# Patient Record
Sex: Male | Born: 1945 | Race: White | Hispanic: No | Marital: Married
Health system: Southern US, Community
[De-identification: ages and names within clinical notes are randomized; demographics above are authoritative.]

---

## 2004-08-25 ENCOUNTER — Emergency Department: Payer: Self-pay | Admitting: Emergency Medicine

## 2007-10-29 ENCOUNTER — Encounter: Payer: Self-pay | Admitting: Internal Medicine

## 2007-10-29 ENCOUNTER — Ambulatory Visit: Payer: Self-pay | Admitting: Internal Medicine

## 2007-11-04 ENCOUNTER — Encounter: Payer: Self-pay | Admitting: Internal Medicine

## 2008-11-02 ENCOUNTER — Emergency Department: Payer: Self-pay | Admitting: Emergency Medicine

## 2008-11-29 ENCOUNTER — Emergency Department: Payer: Self-pay | Admitting: Emergency Medicine

## 2010-07-13 IMAGING — CT CT ABD-PELV W/ CM
1 of 3 series · 13 of 32 positions shown, 19 images · non-contrast
Comparison: None

REASON FOR EXAM: RLQ pain with nausea that began today
COMMENTS:

PROCEDURE:     CT  - CT ABDOMEN / PELVIS  W  - November 30, 2008 [DATE]
RESULT:     History: Right lower quadrant pain
TECHNIQUE: Multiple axial images of the abdomen and pelvis were performed
from the lung bases to the pubic symphysis, with p.o. contrast and with 100
mL of Asovue-K0M intravenous contrast.

[Series 2: appendicitis · axial · 0.77mm/px · z∈[+1266,+1674]mm · 13 of 160 slices shown, 19 images]
[im 12/160  soft-tissue]
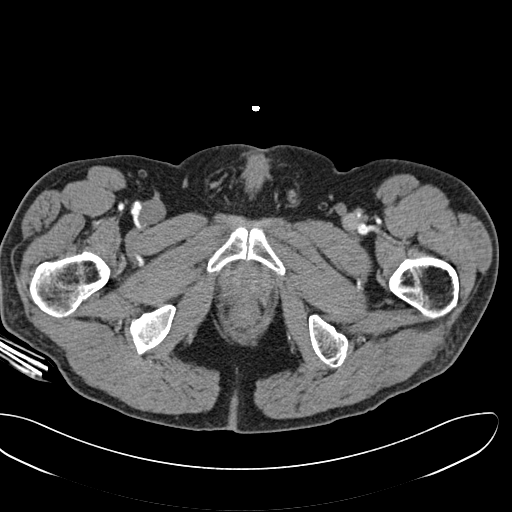
[im 12/160  bone]
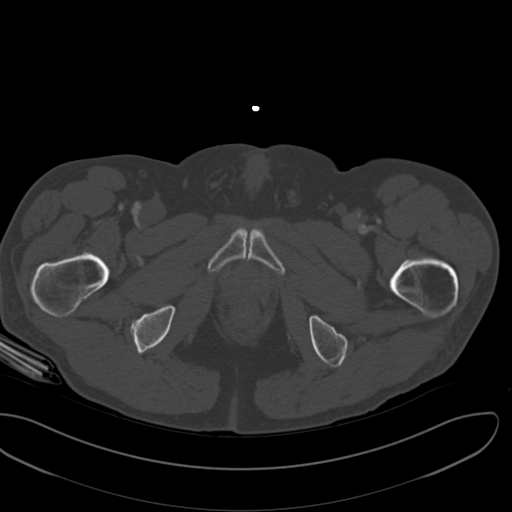
[im 23/160  soft-tissue]
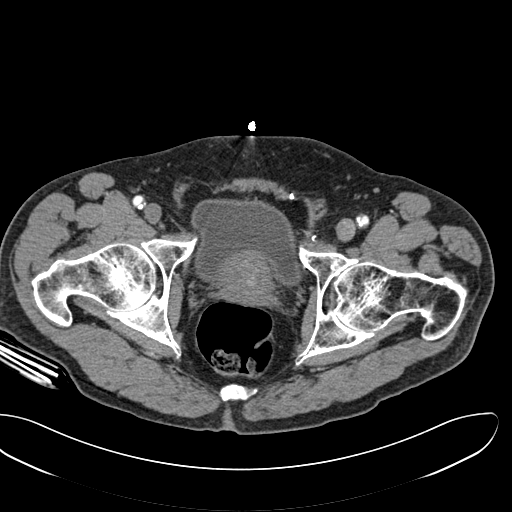
[im 35/160  soft-tissue]
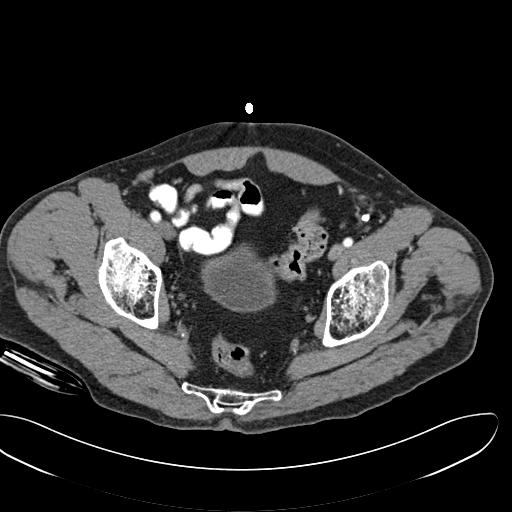
[im 46/160  soft-tissue]
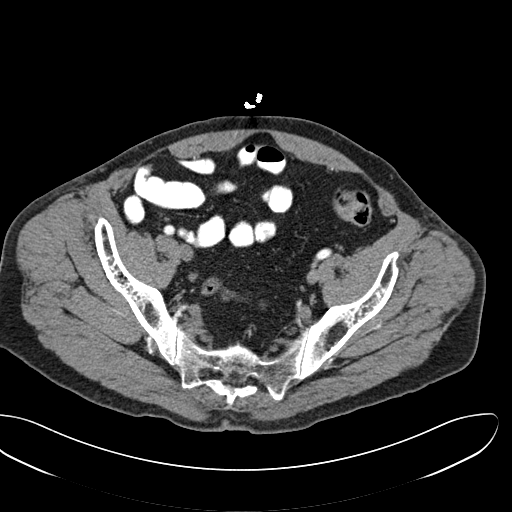
[im 57/160  soft-tissue]
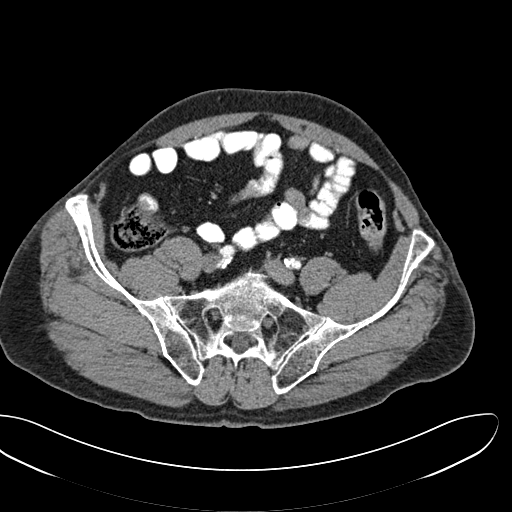
[im 69/160  soft-tissue]
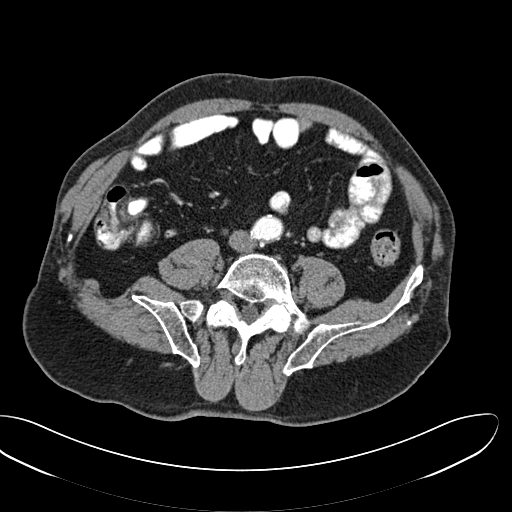
[im 80/160  soft-tissue]
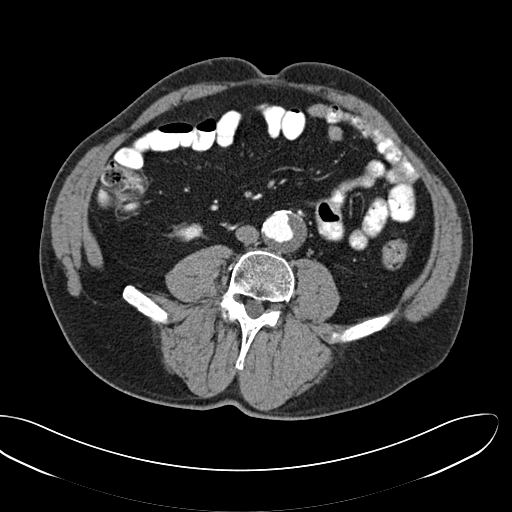
[im 91/160  soft-tissue]
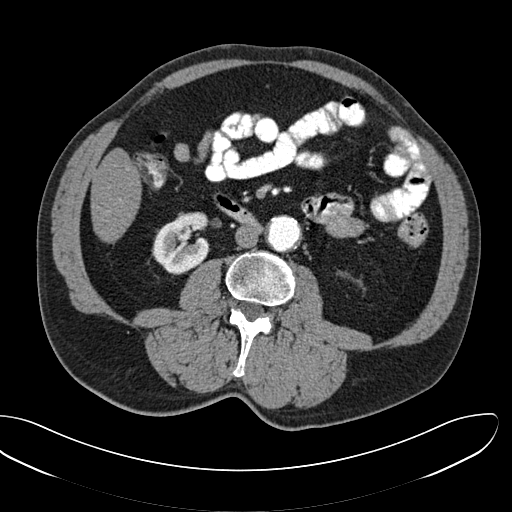
[im 103/160  soft-tissue]
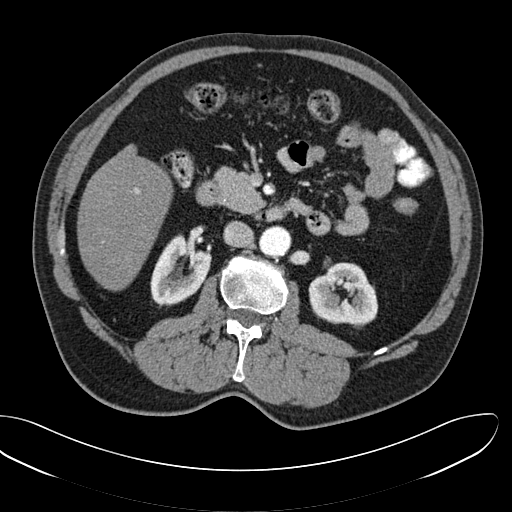
[im 103/160  bone]
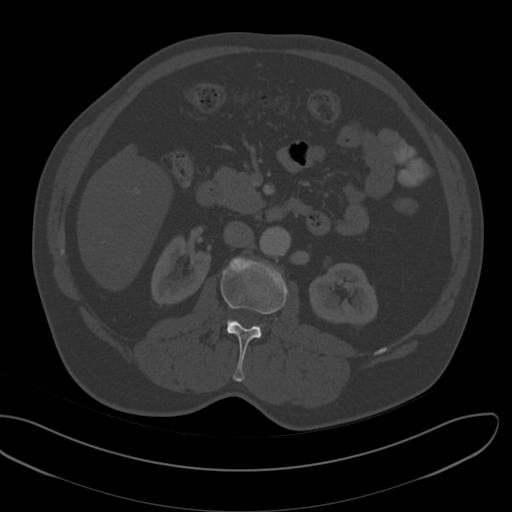
[im 114/160  soft-tissue]
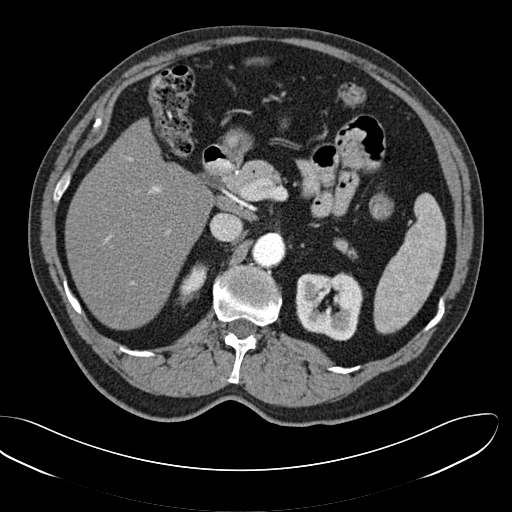
[im 114/160  lung]
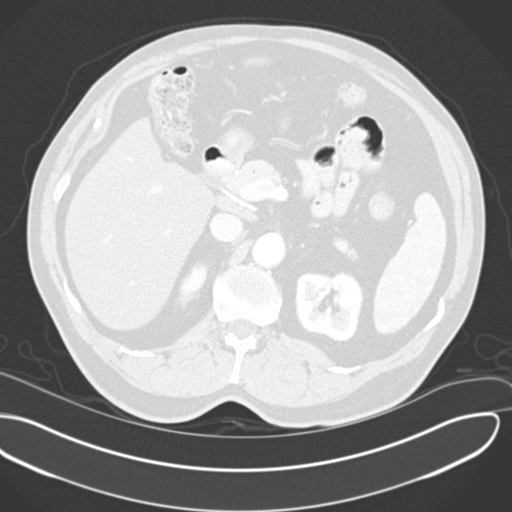
[im 125/160  soft-tissue]
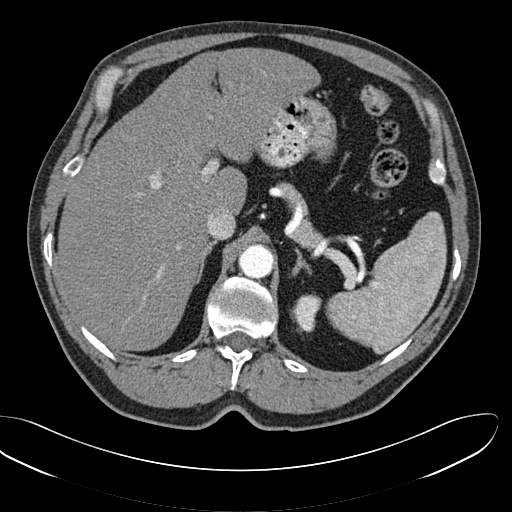
[im 125/160  lung]
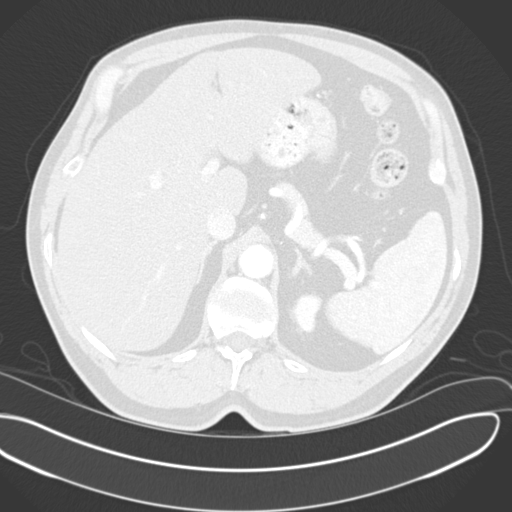
[im 137/160  soft-tissue]
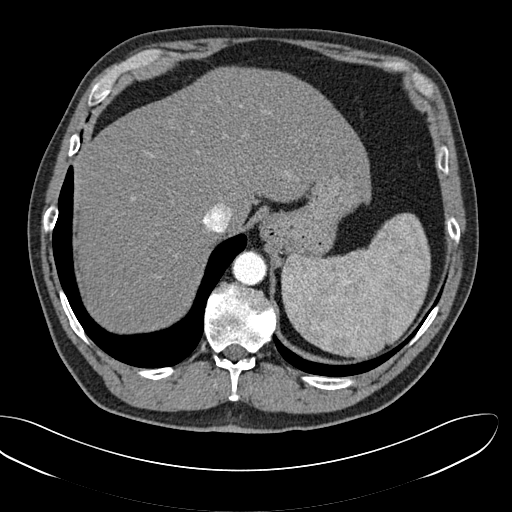
[im 137/160  lung]
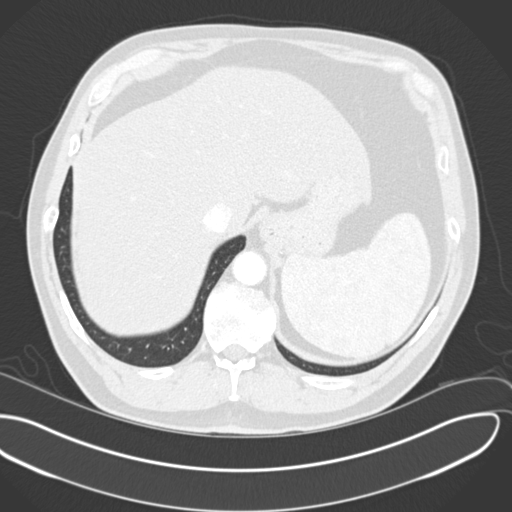
[im 148/160  soft-tissue]
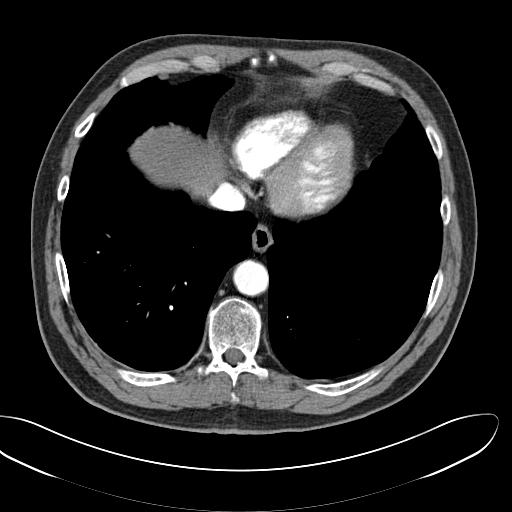
[im 148/160  lung]
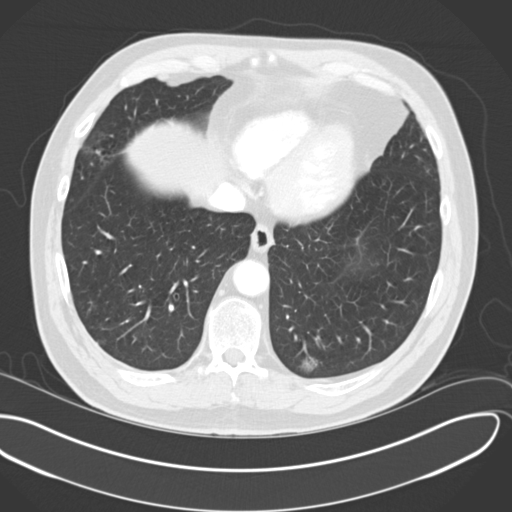

[13 of 32 positions shown; findings below may reference images not displayed]

FINDINGS: The lung bases are clear. There is no pneumothorax. The heart size is
normal.

The liver demonstrates no focal abnormality. There is no intrahepatic or
extrahepatic biliary ductal dilatation. The gallbladder is surgically
absent. The spleen demonstrates no focal abnormality. The kidneys, adrenal
glands, and pancreas are normal. The bladder is unremarkable. There is a
prominent sized prostate gland.

There is a small hiatal hernia. The duodenum, small intestine, and large
intestine demonstrate no contrast extravasation or dilatation. A normal
caliber appendix is visualized in the right lower quadrant without
periappendiceal inflammatory changes. There is no pneumoperitoneum,
pneumatosis, or portal venous gas. There is no abdominal or pelvic free
fluid. There is no lymphadenopathy.

There is a small infrarenal abdominal aortic aneurysm measuring 3.5 cm in
greatest AP diameter and 3.6 cm in greatest transverse diameter. There is a
significant amount of mural thrombus present.

The osseous structures are unremarkable.
IMPRESSION: Normal appendix.

Infrarenal abdominal aortic aneurysm.

## 2011-04-27 ENCOUNTER — Emergency Department: Payer: Self-pay | Admitting: Internal Medicine

## 2013-05-27 ENCOUNTER — Emergency Department: Payer: Self-pay | Admitting: Emergency Medicine

## 2013-05-29 ENCOUNTER — Inpatient Hospital Stay: Payer: Self-pay | Admitting: Internal Medicine

## 2013-05-29 LAB — COMPREHENSIVE METABOLIC PANEL
Albumin: 3.9 g/dL (ref 3.4–5.0)
Alkaline Phosphatase: 112 U/L (ref 50–136)
BUN: 17 mg/dL (ref 7–18)
Calcium, Total: 8.8 mg/dL (ref 8.5–10.1)
EGFR (Non-African Amer.): 35 — ABNORMAL LOW
Glucose: 105 mg/dL — ABNORMAL HIGH (ref 65–99)
Potassium: 4 mmol/L (ref 3.5–5.1)
Total Protein: 7.5 g/dL (ref 6.4–8.2)

## 2013-05-29 LAB — CBC
HGB: 12.7 g/dL — ABNORMAL LOW (ref 13.0–18.0)
MCH: 27.6 pg (ref 26.0–34.0)
MCV: 81 fL (ref 80–100)
Platelet: 118 10*3/uL — ABNORMAL LOW (ref 150–440)
RBC: 4.62 10*6/uL (ref 4.40–5.90)
RDW: 13.3 % (ref 11.5–14.5)
WBC: 9 10*3/uL (ref 3.8–10.6)

## 2013-05-30 LAB — CBC WITH DIFFERENTIAL/PLATELET
Eosinophil #: 0.2 10*3/uL (ref 0.0–0.7)
HGB: 11.6 g/dL — ABNORMAL LOW (ref 13.0–18.0)
Lymphocyte %: 12.7 %
MCHC: 34.7 g/dL (ref 32.0–36.0)
MCV: 81 fL (ref 80–100)
Monocyte #: 0.6 x10 3/mm (ref 0.2–1.0)
Monocyte %: 9.1 %
Neutrophil #: 5.2 10*3/uL (ref 1.4–6.5)
Neutrophil %: 75 %
RBC: 4.14 10*6/uL — ABNORMAL LOW (ref 4.40–5.90)
RDW: 13.4 % (ref 11.5–14.5)

## 2013-05-30 LAB — BASIC METABOLIC PANEL
BUN: 14 mg/dL (ref 7–18)
Calcium, Total: 8.4 mg/dL — ABNORMAL LOW (ref 8.5–10.1)
Co2: 21 mmol/L (ref 21–32)
EGFR (African American): 41 — ABNORMAL LOW
EGFR (Non-African Amer.): 35 — ABNORMAL LOW
Glucose: 109 mg/dL — ABNORMAL HIGH (ref 65–99)
Potassium: 4.1 mmol/L (ref 3.5–5.1)

## 2013-06-01 LAB — BASIC METABOLIC PANEL
BUN: 17 mg/dL (ref 7–18)
Calcium, Total: 8.7 mg/dL (ref 8.5–10.1)
Chloride: 107 mmol/L (ref 98–107)
Co2: 24 mmol/L (ref 21–32)
Creatinine: 1.75 mg/dL — ABNORMAL HIGH (ref 0.60–1.30)
EGFR (African American): 45 — ABNORMAL LOW
EGFR (Non-African Amer.): 39 — ABNORMAL LOW
Glucose: 100 mg/dL — ABNORMAL HIGH (ref 65–99)
Potassium: 4.2 mmol/L (ref 3.5–5.1)
Sodium: 137 mmol/L (ref 136–145)

## 2013-06-01 LAB — CBC WITH DIFFERENTIAL/PLATELET
Basophil #: 0 10*3/uL (ref 0.0–0.1)
Eosinophil #: 0.2 10*3/uL (ref 0.0–0.7)
Eosinophil %: 3.6 %
HCT: 33.4 % — ABNORMAL LOW (ref 40.0–52.0)
Lymphocyte #: 1.2 10*3/uL (ref 1.0–3.6)
MCHC: 34.5 g/dL (ref 32.0–36.0)
Monocyte #: 0.6 x10 3/mm (ref 0.2–1.0)
Neutrophil #: 3.9 10*3/uL (ref 1.4–6.5)
Neutrophil %: 65.4 %
Platelet: 141 10*3/uL — ABNORMAL LOW (ref 150–440)
RBC: 4.15 10*6/uL — ABNORMAL LOW (ref 4.40–5.90)
RDW: 13 % (ref 11.5–14.5)
WBC: 5.9 10*3/uL (ref 3.8–10.6)

## 2013-06-03 LAB — WOUND CULTURE

## 2013-11-18 ENCOUNTER — Ambulatory Visit: Payer: Self-pay | Admitting: Family Medicine

## 2014-11-25 NOTE — H&P (Signed)
PATIENT NAME:  Andre Lowe, Andre Lowe MR#:  [id number] DATE OF BIRTH:  05-Nov-1945  DATE OF ADMISSION:  05/29/2013  [facility] CARE PROVID[facility]  [facility].  EMERGENCY DEPARTMENT REFERRING PHYSICIAN:  Dr. Mindi Junker.   CHIEF COMPLAINT:  Left forearm swelling and erythema.   HISTORY OF PRESENT ILLNESS:  The patient is a 69 year old white male with a history of depression, hyperlipidemia, GERD, hiatal hernia, who reports that a few days ago, he developed a small lesion on his forearm associated with a possible ingrown hair. Subsequently that became like a boil and then started draining. He was seen in the ED on the 23rd where he was started on antibiotics with Bactrim. The patient reports he has been taking Bactrim and then subsequently he also used hydrogen peroxide on his arm, and now he has a large area of skin that has came off near that lesion as well as progressive erythema and warmth on his forearm extending to his elbow. The patient also reports a small lesions on his neck as well as a small boil in his abdomen. He has not had any fevers, chills. The patient denies any previous history of recurrent cellulitis or staph infection in the past. Otherwise, he is denying any abdominal pain, nausea, vomiting or diarrhea.   PAST MEDICAL HISTORY: Significant for:  1.  Depression.  2.  Hyperlipidemia.  3.  GERD with a history of hiatal hernia.  4.  The patient has chronic renal failure based on his creatinine reviewed from 04/27/2011 and then his creatinine is elevated today.   PAST SURGICAL HISTORY:  A history of left leg surgery after a trauma.   ALLERGIES:  None.   MEDICATIONS:  He is on:  1.  Sertraline 100 daily. 2.  Gemfibrozil 600, 1 tab p.o. b.i.d. 3.  Meclizine 25 mg 1 tab p.o. t.i.d. p.r.n. 4.   Omeprazole 20, 1 tab p.o. daily.  5.  Trazodone p.o. at bedtime.  6.  Hydrocodone 10/325 mg 1 tab p.o. q.6 p.r.n.   ALLERGIES:  None.   SOCIAL HISTORY:  Does not smoke, quit 20 years ago. No alcohol or  drug use. Lives with his wife.   FAMILY HISTORY:  Positive for hypertension.   REVIEW OF SYSTEMS:  CONSTITUTIONAL:  Denies any fevers. Denies any fatigue, weakness. Has chronic pain related to his leg injury. No weight loss, no weight gain.  EYES:  No blurred or double vision. No pain. No redness. No inflammation. No glaucoma.  ENT:  No tinnitus. No ear pain. No hearing loss. No seasonal or year-round allergies. No epistaxis. No nasal discharge. No snoring. No postnasal drip. No difficulty swallowing.  RESPIRATORY:  Denies any cough, wheezing, hemoptysis. No COPD, no TB.  CARDIOVASCULAR:  Denies any orthopnea, edema or arrhythmia.  GASTROINTESTINAL:  No nausea, vomiting, diarrhea. No abdominal pain. No hematemesis. No melena. No ulcer. Does have GERD. No IBS. No jaundice. No rectal bleeding.  GENITOURINARY: Denies any dysuria, hematuria, renal calculus or frequency.  ENDOCRINE:  Denies any polyuria or nocturia, or thyroid problems.  HEMATOLOGIC AND LYMPHATIC: Denies any easy bruisability or bleeding.  SKIN:  He complains of erythema involving the leg. He is complaining of small papular-like lesions on his abdomen as well as back, as well as erythema involving his right forearm.  NEUROLOGIC:  No numbness. No CVA. No TIA.  PSYCHIATRIC:  Does have a history of anxiety and depression. No bipolar, no schizophrenia.   PHYSICAL EXAMINATION:  VITAL SIGNS:  Temperature 97.8, pulse 70, respirations 18,  blood pressure 151/69, O2 95%.  GENERAL:  The patient is a well-developed, well-nourished male in no acute distress.  HEENT:  Head atraumatic, normocephalic. Pupils equally round, reactive to light and accommodation. There is no conjunctival pallor. No scleral icterus. Nasal exam shows no drainage or ulceration.  OROPHARYNX:  Clear without any exudate.  NECK:  Supple without any JVD.  CARDIOVASCULAR:  Regular rate and rhythm. No murmurs, rubs, clicks, or gallops. PMI is not displaced.  RESPIRATORY:  No  accessory muscle usage, clear to auscultation bilaterally without any rales, rhonchi, wheezing.  ABDOMEN:  Soft.  EXTREMITIES:  No clubbing, cyanosis, or edema. He has a support boot on on the left lower extremity.  LYMPHATICS:  No lymph nodes palpable.  VASCULAR:  Good DP and PT pulses.  PSYCHIATRIC:  Not anxious or depressed.  NEUROLOGIC:  Awake, alert, oriented x 3. No focal deficits.  SKIN:  The patient had small lesions, some excoriated on his neck and one lesion on his abdomen as well as his left forearm as well. The right forearm has got a large, approximately 2.5 x 2.5 cm lesion, circula-0 chromic with denuded skin with flesh-appearing with surrounding erythema extending to his elbow and his hands with warmth noted.   EVALUATIONS:  Glucose 105, BUN 17, creatinine 1.91, sodium 135, potassium 4.0, chloride 106, CO2 is 22. His GFR is 41. LFTs are normal.   ASSESSMENT AND PLAN:  The patient is a 69 year old white male with history of GERD, hyperlipidemia, and depression, who developed a small lesion on his right forearm and now has erythema and swelling.  1.  Right forearm cellulitis. Based on the appearance, this is likely methicillin-resistant Staphylococcus aureus. At this time, we will treat him with IV vancomycin and Unasyn. Unasyn should cover other organisms including gram-negatives and anaerobic coverage. The patient also has multiple other lesions on his body. At discharge would recommend Hibiclens washes.  2.  Gastroesophageal reflux disease. We will continue omeprazole.  3.  Likely chronic renal failure, stage III, based on his creatinine reviewed from 2012. This is chronic in nature. [facility]. The patient [facility] be [facility] or locally at discharge.  4.  Depression, anxiety. Continue trazodone and sertraline as taking at home.  5.  Hyperlipidemia. Continue gemfibrozil.  6.  Miscellaneous. We will place him on heparin for DVT  prophylaxis.   TIME SPENT:  Note 45 minutes spent on this H and P.   ____________________________ Lacie ScottsShreyang H. Allena KatzPatel, MD shp:jm D: 05/29/2013 11:43:16 ET T: 05/29/2013 12:22:33 ET JOB#: [id number]  cc: Malissia Rabbani H. Allena KatzPatel, MD, <Dictator> Charise CarwinSHREYANG H Senna Lape MD ELECTRONICALLY SIGNED 06/01/2013 12:14

## 2014-11-25 NOTE — Discharge Summary (Signed)
PATIENT NAME:  Andre ForehandJOHNSON, Andre W MR#:  [id number] DATE OF BIRTH:  Oct 16, 1945  DATE OF ADMISSION:  05/29/2013 DATE OF DISCHARGE:  06/01/2013  ADMITTING PHYSICIAN: Shreyang H. Allena KatzPatel, MD  DISCHARGING PHYSICIAN: Enid Baasadhika Kaiser Belluomini, MD   [facility] [place]   CONSULTATIONS IN THE HOSPITAL: None.   DISCHARGE DIAGNOSES: 1.  Right forearm cellulitis with methicillin-resistant Staphylococcus aureus wound infection.  2.  Hyperlipidemia.  3.  Chronic kidney disease with baseline creatinine around 1.9.  4.  Status post left leg crush injury, using prosthetic device.  5.  Multiple boils on the body.   DISCHARGE MEDICATIONS:   1.  Omeprazole 20 mg p.o. daily.  2.  Sertraline 100 mg p.o. daily.  3.  Lortab 5/500 mg, 1 tablet q.6 hours p.r.n. for pain.  4.  Tramadol 50 mg p.o. q.6 hours p.r.n. for pain.  5.  Trazodone 100 mg p.o. at bedtime.  6.  Bactrim double-strength 1 tablet p.o. b.i.d. for 9 days.  7.  Hibiclens 4% topical soap applied to affected area once a day.   DISCHARGE DIET: Low-fat, low-cholesterol diet.   DISCHARGE ACTIVITY: As tolerated.    FOLLOWUP INSTRUCTIONS: 1.  PCP followup in 1 to 2 weeks.  2.  Keep right arm elevated until swelling has improved. If no improvement within a week, call MD or if symptoms worsen call MD or come to the ER.   LABS AND IMAGING STUDIES:  1.  WBC 5.9, hemoglobin 11.5, hematocrit 33.4, platelet count 141.  2.  Sodium 137, potassium 4.2, chloride 107, bicarb 24, BUN 17, creatinine 1.73, glucose 100 and calcium of 8.7.  3.  Wound cultures growing MRSA.   4.  Blood cultures were negative.   BRIEF HOSPITAL COURSE: Mr. Andre Lowe is a 69 year old elderly male with past medical history significant for chronic kidney disease, depression and gastroesophageal reflux disease. He presented to the hospital secondary to worsening right forearm swelling.  1.  Right forearm cellulitis with an open wound. Wound cultures growing MRSA.  He was  initially placed on vancomycin and Unasyn. Once the culture results were back, the patient was continued on vancomycin and Unasyn was discontinued. He was already on contact isolation. He also has multiple boils on his body so chlorhexidine soap is being prescribed at the time of discharge. He is being discharged on Bactrim and his swelling has come down immensely. He was also advised to keep his arm up even after discharge. All his other medications were continued without any changes and he was advised to come back to the ER or call MD if his symptoms were to worsen. His course has been otherwise uneventful in the hospital.   DISCHARGE CONDITION: Stable.   DISCHARGE DISPOSITION: Home.   TIME SPENT ON DISCHARGE: 40 minutes.   ____________________________ Enid Baasadhika Shaquanda Graves, MD rk:cs D: 06/01/2013 15:27:09 ET T: 06/01/2013 18:30:58 ET JOB#: [id number]  cc: Enid Baasadhika Chamar Broughton, MD, <Dictator> [facility] Enid BaasADHIKA Faige Seely MD ELECTRONICALLY SIGNED 06/15/2013 13:55

## 2014-12-04 DEATH — deceased

## 2015-07-01 IMAGING — CR DG CHEST 2V
1 series · 2 of 2 positions shown · non-contrast
Comparison: None.

CLINICAL DATA: Weight loss, bradycardia

EXAM:
CHEST  2 VIEW

[Series 1: pa · 0.17mm/px · 2 of 2 slices shown]
[im 1/2]
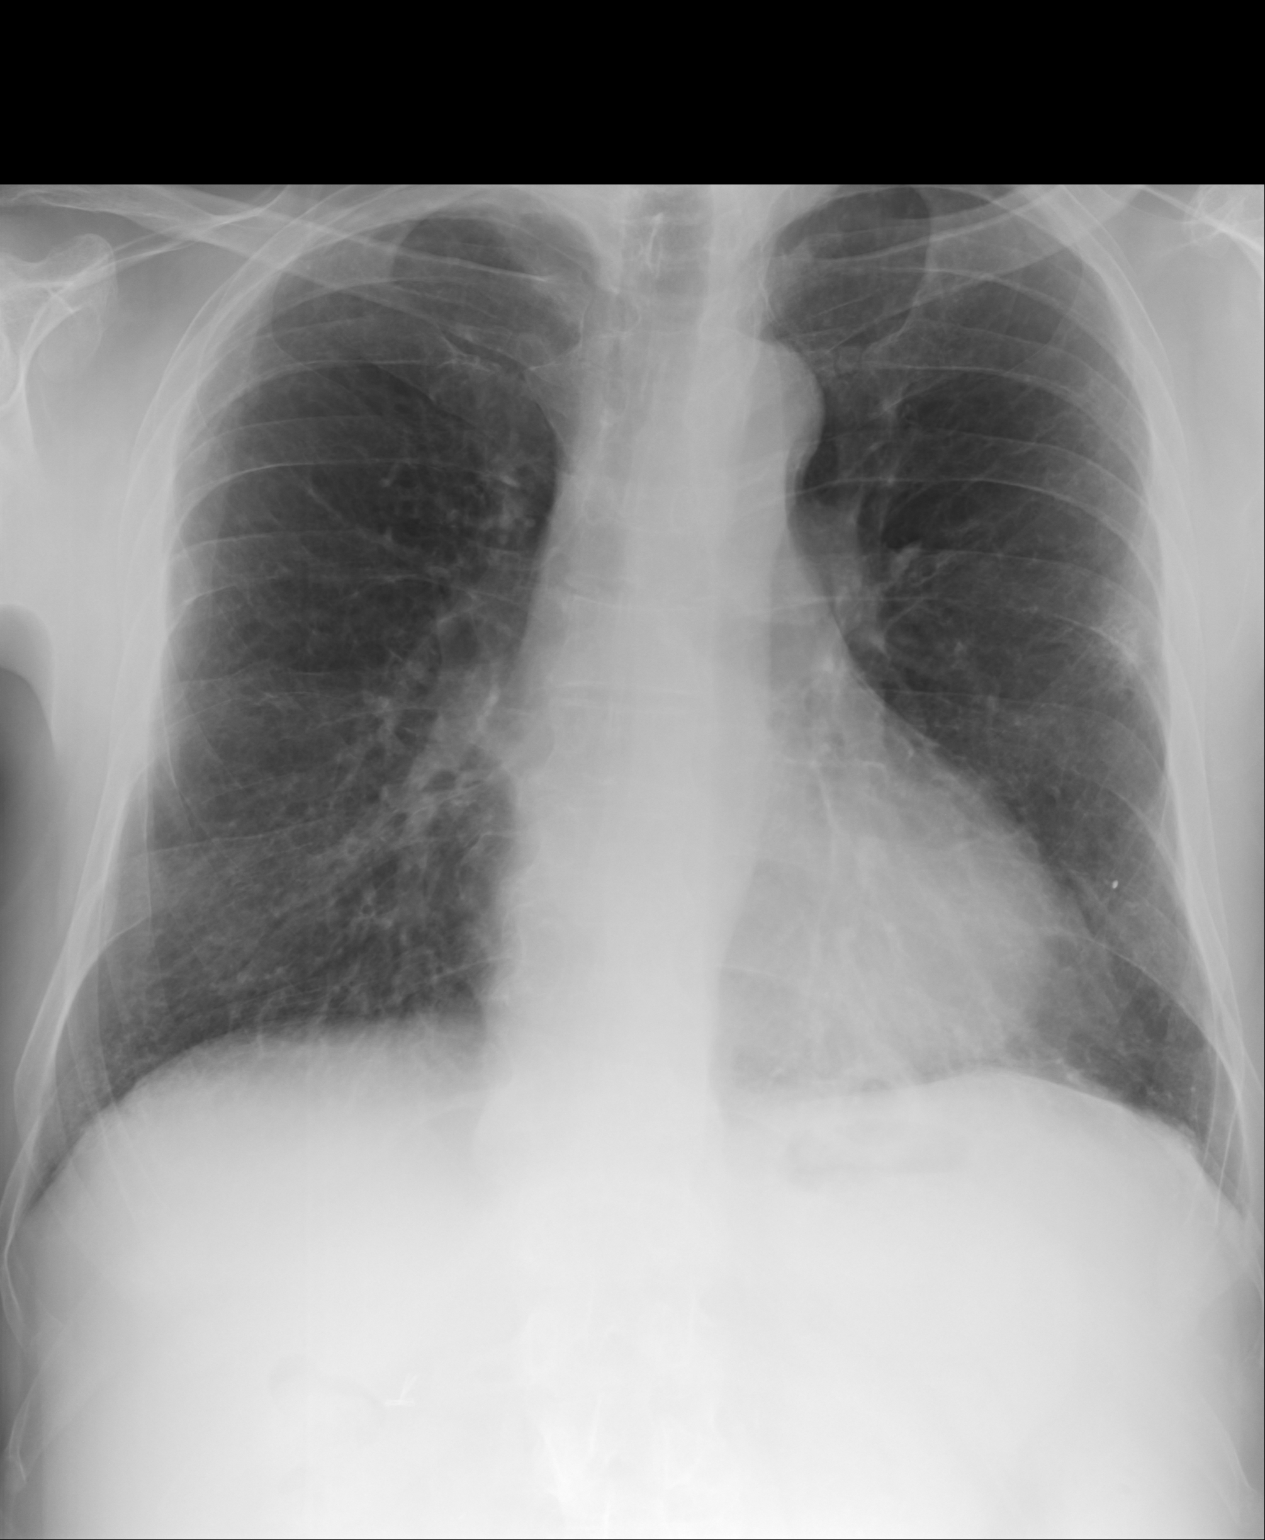
[im 2/2]
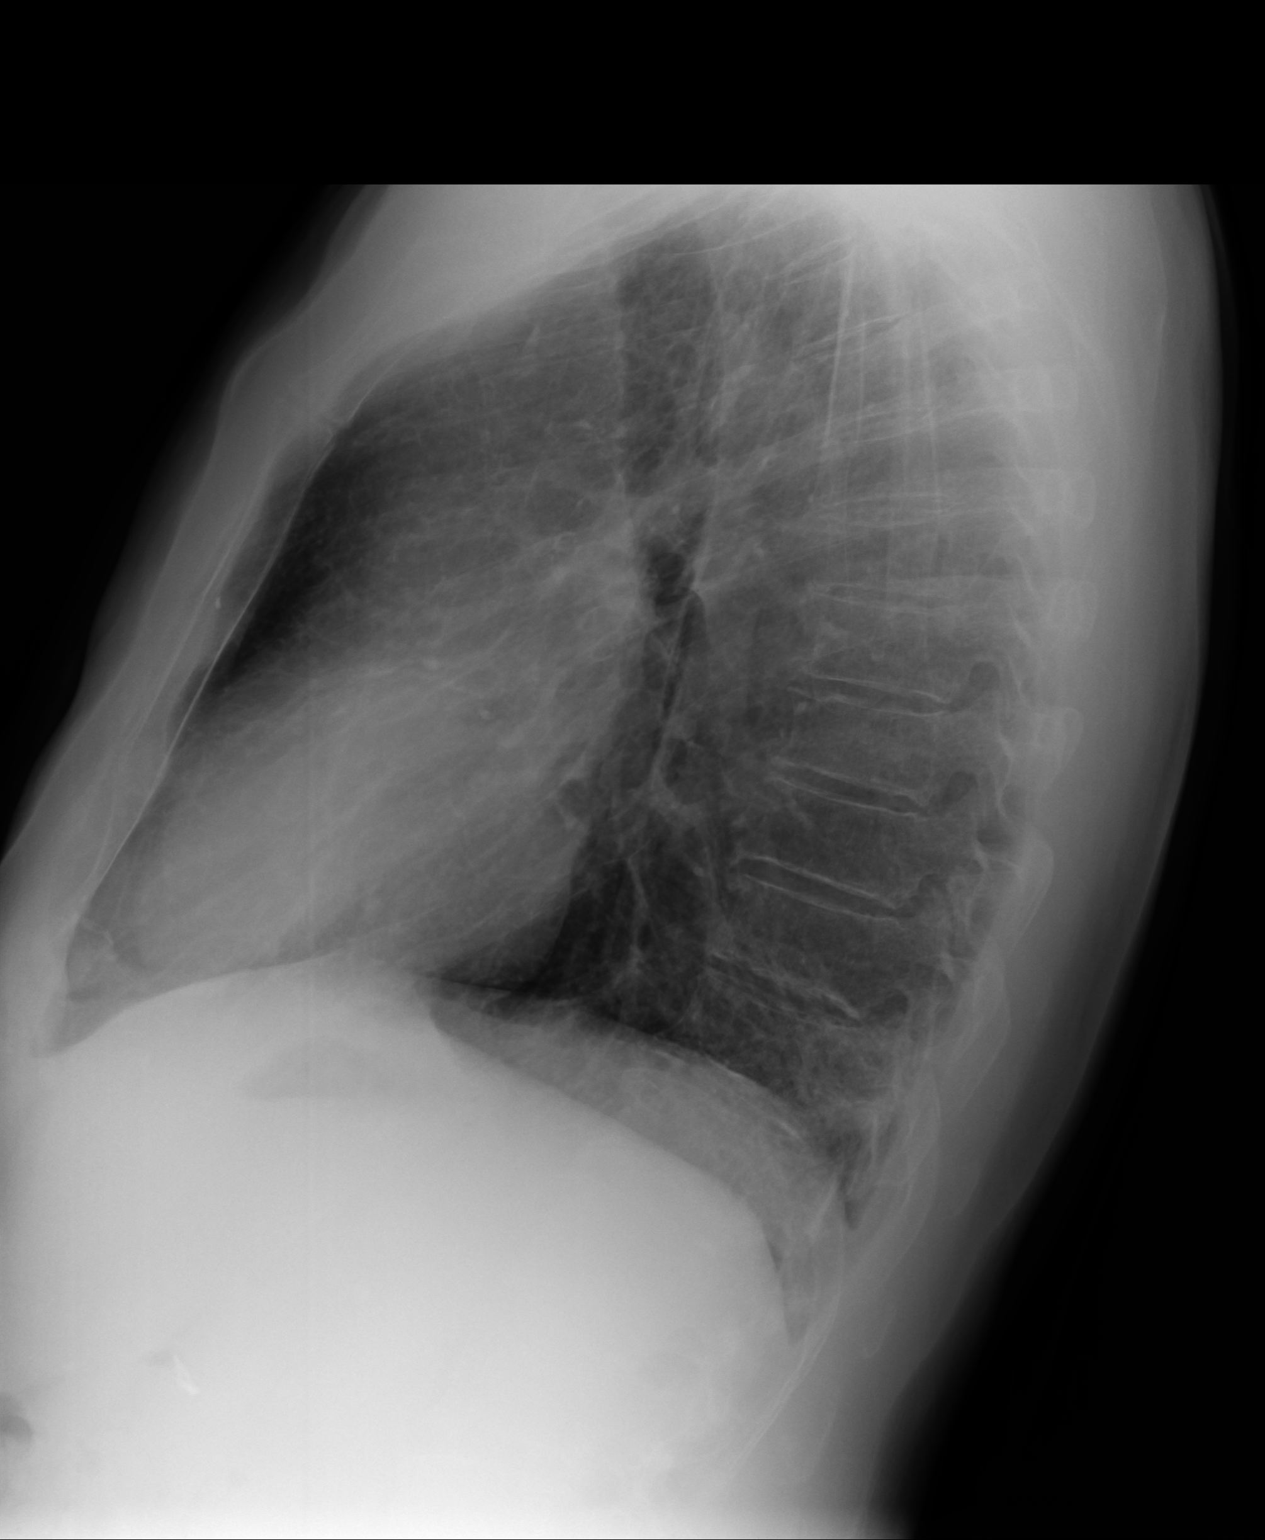

[2 of 2 positions shown; findings below may reference images not displayed]

FINDINGS: Mild enlargement of cardiac silhouette.

Mediastinal contours and pulmonary vascularity normal.

Peribronchial thickening and slight accentuation of interstitial
markings.

Questionable focus of infiltrate and lateral mid left lung
underlying nodule not completely excluded.

Remaining lungs clear.

No pleural effusion or pneumothorax.

Bones demineralized.
IMPRESSION: Minimal enlargement of cardiac silhouette.

Mild bronchitic changes.

Questionable focus of infiltrate at lateral mid left lung though
this requires radiographic followup until resolution to exclude
underlying pulmonary nodule.
